# Patient Record
Sex: Male | Born: 1985 | Race: Black or African American | Hispanic: No | Marital: Married | Smoking: Never smoker
Health system: Southern US, Community
[De-identification: ages and names within clinical notes are randomized; demographics above are authoritative.]

## PROBLEM LIST (undated history)

## (undated) DIAGNOSIS — R42 Dizziness and giddiness: Secondary | ICD-10-CM

## (undated) HISTORY — DX: Dizziness and giddiness: R42

---

## 2017-09-15 ENCOUNTER — Encounter: Payer: Self-pay | Admitting: Neurology

## 2017-09-15 ENCOUNTER — Encounter (INDEPENDENT_AMBULATORY_CARE_PROVIDER_SITE_OTHER): Payer: Self-pay

## 2017-09-15 ENCOUNTER — Ambulatory Visit: Payer: BLUE CROSS/BLUE SHIELD | Admitting: Neurology

## 2017-09-15 VITALS — BP 137/87 | HR 61 | Ht 70.5 in | Wt 174.0 lb

## 2017-09-15 DIAGNOSIS — G119 Hereditary ataxia, unspecified: Secondary | ICD-10-CM | POA: Diagnosis not present

## 2017-09-15 DIAGNOSIS — R2689 Other abnormalities of gait and mobility: Secondary | ICD-10-CM

## 2017-09-15 DIAGNOSIS — R278 Other lack of coordination: Secondary | ICD-10-CM | POA: Diagnosis not present

## 2017-09-15 DIAGNOSIS — G319 Degenerative disease of nervous system, unspecified: Secondary | ICD-10-CM | POA: Diagnosis not present

## 2017-09-15 DIAGNOSIS — G3281 Cerebellar ataxia in diseases classified elsewhere: Secondary | ICD-10-CM

## 2017-09-15 NOTE — Progress Notes (Signed)
Subjective:    Patient ID: Roberto Randolph is a 32 y.o. male.  HPI     Roberto Foley, MD, PhD [facility] [address], [address] [address] [place], [place] [address]  Dear Roberto Randolph,   I saw your patient, Roberto Randolph, upon your kind request, in my neurologic clinic today for initial consultation of his abnormal brain MRI. The patient is unaccompanied today. As you know, Mr. Roberto Randolph is a 32 year old right-handed gentleman who presented to student health center at [facility] [employer] with right-sided leg pain. He has a history of vertigo for about 6-7 years. He had a brain MRI with and without contrast on 02/27/2017 and I reviewed the results: Diffuse cerebral atrophy including the vermis. Differential diagnosis would include hypothyroidism, EtOH, chronic delayed and use, paraneoplastic syndromes, congenital hereditary disorder such as pedal cerebellar ataxia, Friedreich's ataxia or mitochondrial disease. He had a lumbar spine MRI with and without contrast on 02/27/2017 and I reviewed the results: Chronic bilateral spondylolysis of L5 with minimal synovitis enhancement and degenerative facet L5-S1 without lateral recess stenosis. He reports issues with his balance and coordination.  He is married and lives with his wife, they have no children. He is a nonsmoker, drinks alcohol about 2-3 glasses per week. Has caffeine occasionally.  He reports that about 6 or 7 or even 8 years ago he started having issues with coordination and insecurity with complex tasks such as dancing, felt like he was misstepping when jogging, felt insecure with jumping. He has not noticed a progressive course but has probably plateaued at this time. He denies any family history of a similar issue, no evidence of hereditary ataxia, hereditary neurological issues. His family is originally from [place], he was born in [place], grew up some and [place] where his parents lived but he moved back to [place] to live with his aunt  when he was 44. He is currently studying to become an IT specialist. He denies any one-sided weakness or numbness or tingling, has had intermittent milder headaches. This was attributed to stress and lack of sleep. He was treated for anxiety as I understand some years ago in [place]. He has 4 siblings all of home live in [place]. He has had an intermittent head tremor and hand tremor, this gets worse when he is nervous. He tries to exercise and eat well and try to keep a schedule for his sleep. He denies any recent issues with fevers or chills or swelling or lymphadenopathy, no abdominal pain or shortness of breath. He has noticed no cognitive or memory issues, never had epilepsy or exposure to Dilantin. He had blood work in your office on 09/10/2017 with results pending. We will try to get those test results from your office.  His Past Medical History Is Significant For: Past Medical History:  Diagnosis Date  . Vertigo    His Past Surgical History Is Significant For:  His Family History Is Significant For: No family history on file.  His Social History Is Significant For: Social History   Socioeconomic History  . Marital status: Married    Spouse name: Not on file  . Number of children: Not on file  . Years of education: Not on file  . Highest education level: Not on file  Social Needs  . Financial resource strain: Not on file  . Food insecurity - worry: Not on file  . Food insecurity - inability: Not on file  . Transportation needs - medical: Not on file  . Transportation  needs - non-medical: Not on file  Occupational History  . Not on file  Tobacco Use  . Smoking status: Never Smoker  . Smokeless tobacco: Never Used  Substance and Sexual Activity  . Alcohol use: Yes    Alcohol/week: 1.2 - 1.8 oz    Types: 2 - 3 Glasses of wine per week  . Drug use: No  . Sexual activity: Not on file  Other Topics Concern  . Not on file  Social History Narrative  . Not on file    His  Allergies Are:  Allergies  Allergen Reactions  . Shrimp [Shellfish Allergy]   :   His Current Medications Are:  No outpatient encounter medications on file as of 09/15/2017.   No facility-administered encounter medications on file as of 09/15/2017.   : Review of Systems:  Out of a complete 14 point review of systems, all are reviewed and negative with the exception of these symptoms as listed below: Review of Systems  Neurological:       Pt presents today to discuss his gait unsteadiness. Pt says that he used to play sports and dance but he cannot do that now because he is so imbalanced.    Objective:  Neurological Exam  Physical Exam Physical Examination:   Vitals:   09/15/17 1429  BP: 137/87  Pulse: 61    General Examination: The patient is a very pleasant 32 y.o. male in no acute distress. He appears well-developed and well-nourished and well groomed. Is mildly anxious appearing.  HEENT: Normocephalic, atraumatic, pupils are equal, round and reactive to light and accommodation. Extraocular tracking is good without limitation to gaze excursion or nystagmus noted. Normal smooth pursuit is noted. Hearing is grossly intact. Face is symmetric with normal facial animation and normal facial sensation. Speech is clear with no dysarthria noted. There is no hypophonia. He has no voice tremor but he does have a subtle intermittent head tremor which is a side to side fairly fast frequency tremor. Tongue protrudes centrally and palate elevates symmetrically.    Chest: Clear to auscultation without wheezing, rhonchi or crackles noted.  Heart: S1+S2+0, regular and normal without murmurs, rubs or gallops noted.   Abdomen: Soft, non-tender and non-distended with normal bowel sounds appreciated on auscultation.  Extremities: There is no pitting edema in the distal lower extremities bilaterally. Pedal pulses are intact.  Skin: Warm and dry without trophic changes noted.  Musculoskeletal:  exam reveals no obvious joint deformities, tenderness or joint swelling or erythema.   Neurologically:  Mental status: The patient is awake, alert and oriented in all 4 spheres. His immediate and remote memory, attention, language skills and fund of knowledge are appropriate. There is no evidence of aphasia, agnosia, apraxia or anomia. Speech is clear with normal prosody and enunciation. Thought process is linear. Mood is normal and affect is normal.  Cranial nerves II - XII are as described above under HEENT exam. In addition: shoulder shrug is normal with equal shoulder height noted. Motor exam: Normal bulk, strength and tone is noted. There is no drift, or rebound. Romberg is negative, with the exception of mild swaying. Reflexes are 1+ throughout but possibly absent in both ankles, toes are equivocal or downgoing bilaterally. Fine motor skills and coordination: He has no significant findings motor dyscontrol in the upper and lower extremities. Heel-to-shin shows no obvious dysmetria. On finger to nose testing, he has no obvious dysmetria or intention tremor with the exception of minimal trembling or endpoint intention tremor noted.  Sensory exam: intact to light touch, pinprick, vibration, temperature sense in the upper and lower extremities.  Gait, station and balance: He stands easily, but stands naturally slightly wide-based. He walks with slight insecurity noted. Tandem walk is challenging for him.                Assessment and Plan:   In summary, Kamal Jurgens is a very pleasant 32 y.o.-year old male with a  benign medical history who presents for neurologic consultation for his several year history of incoordination and balance problems, recent finding of significance cerebellar and vermis atrophy on a brain MRI. The differential diagnosis does include in his case congenital cerebellar atrophy, paraneoplastic cerebellar degeneration, and some form of hereditary spinocerebellar atrophy. He has  a mildly abnormal exam, no telltale family history of a similar issue but some of his family history is not known in detail. I would like to proceed with a cervical spine MRI and CT chest, abdomen and pelvis to screen for an underlying cancer. He does not endorse much in the way of progression or systemic issues or recurrent neurological symptoms otherwise. His exam is fairly benign otherwise. Ultimately, he may benefit from evaluation and further treatment at a larger movement disorders center such as [facility], he would prefer [facility] as he lives in [place]. He may benefit from additional blood work including paraneoplastic profile or cerebellar ataxia panel.we will keep them posted as to his test results for now and proceed from there. I answered all his questions today and he was in agreement.  Thank you very much for allowing me to participate in the care of this nice patient. If I can be of any further assistance to you please do not hesitate to call me at [phone].  Sincerely,   Roberto Foley, MD, PhD

## 2017-09-15 NOTE — Patient Instructions (Addendum)
You have a mildly abnormal exam with regards to your coordination and walking.  You have evidence of shrinkage/atrophy of your cerebellum.  There are many reasons for cerebellar atrophy including hereditary/genetic causes, being born with a smaller cerebellum and some serious causes, like an underlying cancer.  As discussed we will proceed with a CT chest, abdomen and pelvis to screen for any underlying lesion. In addition, I would like to do an MRI of your neck to look at the size and structure of the cervical spine  and particularly spinal cord.  Based on these initial test results, I would like to suggest a referral to a more comprehensive movement disorder center such as at [facility]. You may benefit from additional specialized blood tests.  For now, we will keep you posted as to your test results.

## 2017-09-17 ENCOUNTER — Telehealth: Payer: Self-pay | Admitting: Neurology

## 2017-09-17 DIAGNOSIS — G119 Hereditary ataxia, unspecified: Secondary | ICD-10-CM

## 2017-09-17 DIAGNOSIS — G3281 Cerebellar ataxia in diseases classified elsewhere: Secondary | ICD-10-CM

## 2017-09-17 NOTE — Telephone Encounter (Signed)
Drenda FreezeFran with [facility] informed me the CT orders need to be switch to a CT with contrast. Not with/without contrast. When you get a chance can you please switch the orders.

## 2017-09-17 NOTE — Telephone Encounter (Signed)
Noted, thank you

## 2017-09-17 NOTE — Telephone Encounter (Signed)
Received VO from Dr. Frances FurbishAthar for CT w contrast for chest, abdomen, and pelvis. Orders changed.

## 2017-09-26 ENCOUNTER — Ambulatory Visit
Admission: RE | Admit: 2017-09-26 | Discharge: 2017-09-26 | Disposition: A | Discharge status: Home / Self Care | Payer: BLUE CROSS/BLUE SHIELD | Source: Ambulatory Visit | Attending: Neurology | Admitting: Neurology

## 2017-09-26 DIAGNOSIS — G3281 Cerebellar ataxia in diseases classified elsewhere: Secondary | ICD-10-CM

## 2017-09-26 DIAGNOSIS — G319 Degenerative disease of nervous system, unspecified: Secondary | ICD-10-CM

## 2017-09-26 DIAGNOSIS — G119 Hereditary ataxia, unspecified: Secondary | ICD-10-CM

## 2017-09-26 MED ORDER — GADOBENATE DIMEGLUMINE 529 MG/ML IV SOLN
15.0000 mL | Freq: Once | INTRAVENOUS | Status: AC | PRN
  Administered 2017-09-26: 15 mL via INTRAVENOUS

## 2017-09-26 MED ORDER — IOPAMIDOL (ISOVUE-300) INJECTION 61%
100.0000 mL | Freq: Once | INTRAVENOUS | Status: AC | PRN
  Administered 2017-09-26: 100 mL via INTRAVENOUS

## 2017-09-29 NOTE — Progress Notes (Signed)
Please advise patient that his cervical spine MRI with and without contrast showed benign findings, his CT abdomen, chest and pelvis showed benign findings, no evidence of malignancy thankfully. If he still wishes, I would like to refer him to [facility] for further evaluation of his cerebellar atrophy. We discussed this at his visit with me. Please let me know and I'll place a referral. I will see him back as needed at this point.

## 2017-09-30 ENCOUNTER — Telehealth: Payer: Self-pay

## 2017-09-30 DIAGNOSIS — G319 Degenerative disease of nervous system, unspecified: Secondary | ICD-10-CM

## 2017-09-30 NOTE — Telephone Encounter (Signed)
-----   Message from Huston FoleySaima Athar, MD sent at 09/29/2017  4:36 PM EST ----- Please advise patient that his cervical spine MRI with and without contrast showed benign findings, his CT abdomen, chest and pelvis showed benign findings, no evidence of malignancy thankfully. If he still wishes, I would like to refer him to [facility] for further evaluation of his cerebellar atrophy. We discussed this at his visit with me. Please let me know and I'll place a referral. I will see him back as needed at this point.

## 2017-09-30 NOTE — Telephone Encounter (Signed)
I called pt. I advised him that his cervical spine MRI, CT abdomen, pelvis, and chest all showed benign findings, no evidence of malignancy. Dr. Frances FurbishAthar will place a referral for [facility] if [facility] wishes. Pt does want this referral to [facility]. Pt verbalized understanding of results. Pt had no questions at this time but was encouraged to call back if questions arise.

## 2017-10-01 NOTE — Telephone Encounter (Signed)
Referral has been been sent to [facility]  Telephone [phone] - fax - [phone]. Called Patient   And left detailed message relaying he had to go to [facility] to pick up his MRI and CT's to take with him to [facility] [phone].

## 2017-10-02 ENCOUNTER — Ambulatory Visit
Admission: RE | Admit: 2017-10-02 | Discharge: 2017-10-02 | Disposition: A | Discharge status: Home / Self Care | Payer: BLUE CROSS/BLUE SHIELD | Source: Ambulatory Visit | Attending: Neurology | Admitting: Neurology

## 2017-10-02 ENCOUNTER — Telehealth: Payer: Self-pay

## 2017-10-02 DIAGNOSIS — M25521 Pain in right elbow: Secondary | ICD-10-CM

## 2017-10-02 NOTE — Telephone Encounter (Signed)
Roberto JoinerRebecca called back, was informed of the order.

## 2017-10-02 NOTE — Telephone Encounter (Signed)
Received a call from [facility], Lurena JoinerRebecca, pt presented to their office with pain in his R elbow from where the IV was for his CT scans. The radiologist looked out it, found the area to be hard, recommends that we perform an xray to make sure the IV catheter is not still in the vein. The are asking for an order for a 2 view Xray r elbow.  I spoke with Dr. Frances FurbishAthar and she is agreeable to this. Order placed.  I called Lurena JoinerRebecca back, no answer, left a message asking her to call us back.

## 2017-10-06 ENCOUNTER — Telehealth: Payer: Self-pay | Admitting: Neurology

## 2017-10-06 NOTE — Telephone Encounter (Signed)
I spoke with Dr. Frances FurbishAthar. Pt's right elbow x ray is normal.  I have already discussed with pt his CT and MRI results.  I called pt. No answer, left a message asking him to call me back.

## 2017-10-06 NOTE — Telephone Encounter (Signed)
Patient requesting x-ray results of elbow,MRI and CT results.

## 2017-10-07 NOTE — Telephone Encounter (Signed)
Pt called back, I advised him that his xray of his elbow was normal, no catheter retained. Pt says that his IV site on right elbow is swollen, red, and hard. I encouraged him to see PCP to discuss. He says that he has already seen his PCP and they are unsure of what to do about it. I encouraged perhaps an urgent care appointment for it to be examined too, and if it is quite painful, he should consider going to ER. Pt said that it is not painful. Pt said he will consider UC.

## 2018-10-06 IMAGING — CT CT ABD-PELV W/ CM
2 of 4 series · 15 of 46 positions shown, 17 images · IV contrast (APPLIED)
Comparison: None.

CLINICAL DATA: Cerebellar ataxia and abnormal brain MRI. Evaluate
for primary malignancy.

EXAM:
CT CHEST, ABDOMEN, AND PELVIS WITH CONTRAST
TECHNIQUE: Multidetector CT imaging of the chest, abdomen and pelvis was
performed following the standard protocol during bolus
administration of intravenous contrast.
CONTRAST:  100mL [id number] IOPAMIDOL ([id number]) INJECTION 61%

[Series 2: chest/abd/pelvis w/cm · axial · 0.78mm/px · z∈[+609,+1206]mm · 12 of 223 slices shown, 14 images]
[im 12/223  soft-tissue]
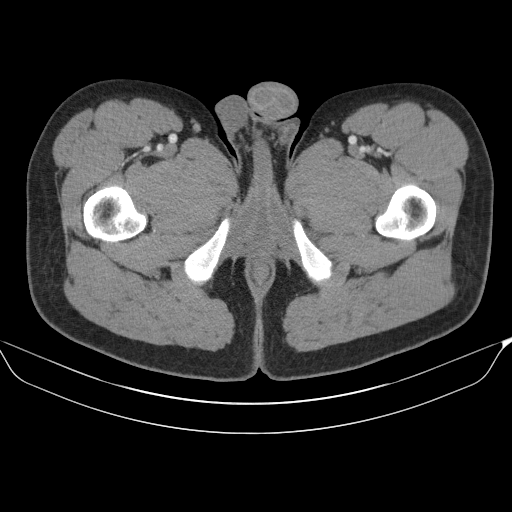
[im 12/223  bone]
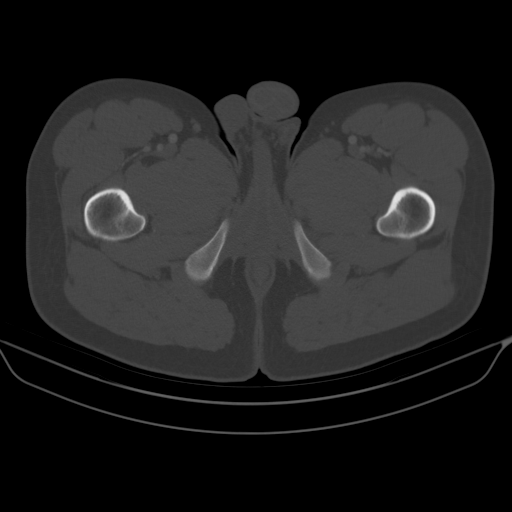
[im 34/223  soft-tissue]
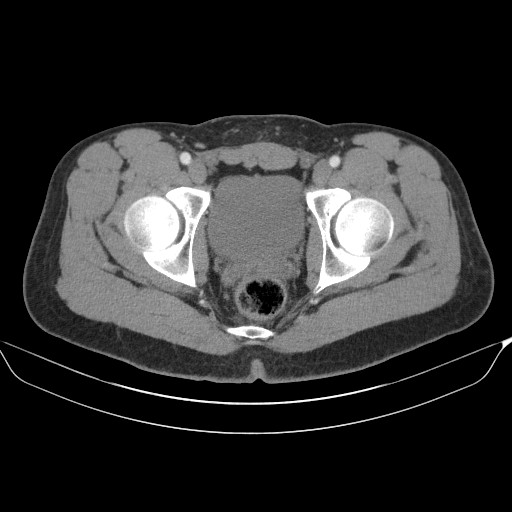
[im 45/223  soft-tissue]
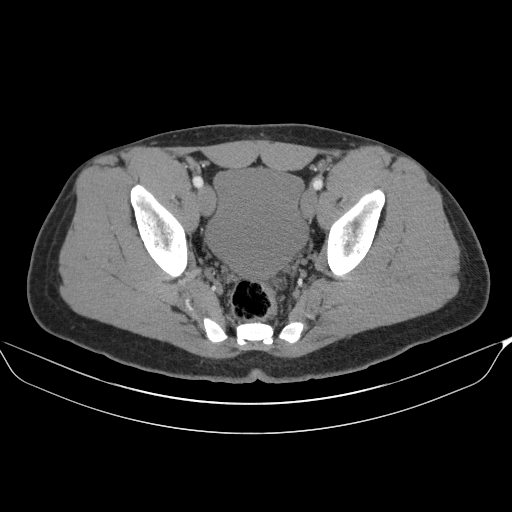
[im 67/223  soft-tissue]
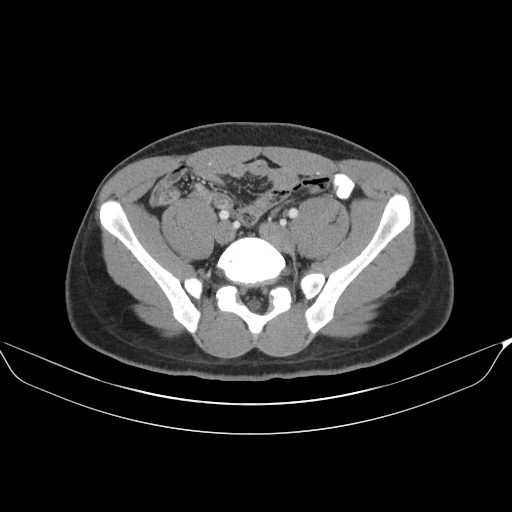
[im 89/223  soft-tissue]
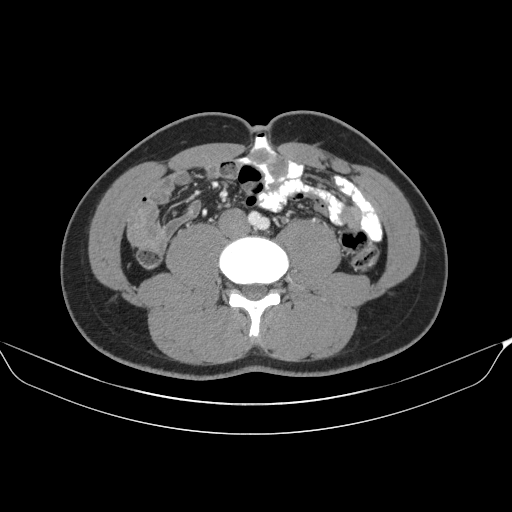
[im 100/223  soft-tissue]
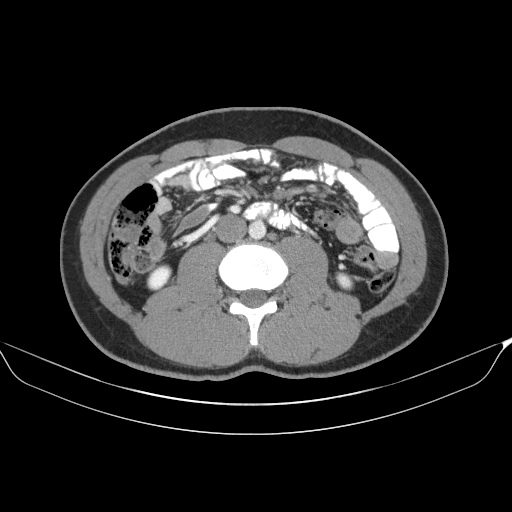
[im 123/223  soft-tissue]
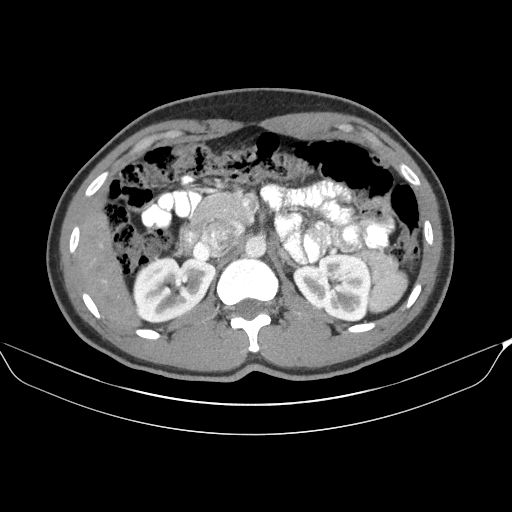
[im 134/223  soft-tissue]
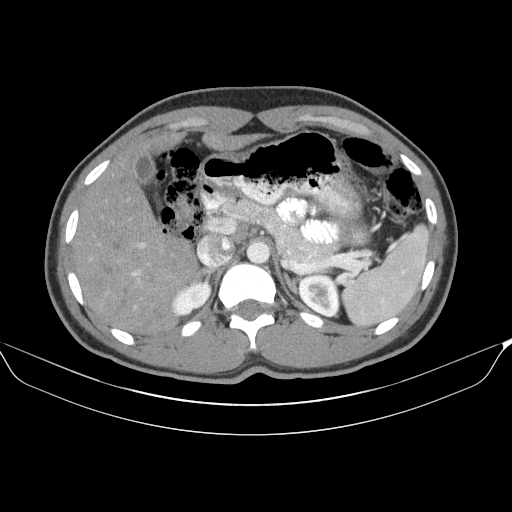
[im 156/223  soft-tissue]
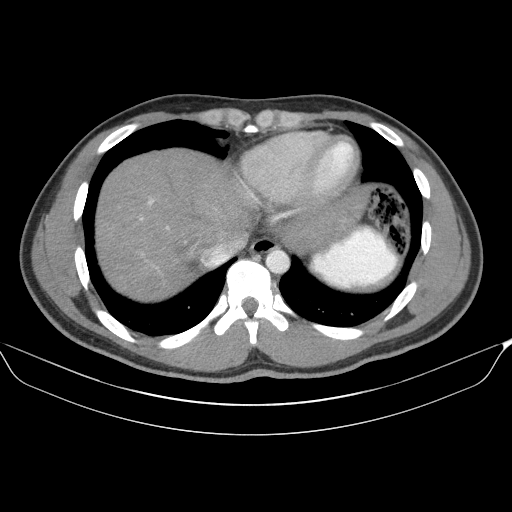
[im 156/223  bone]
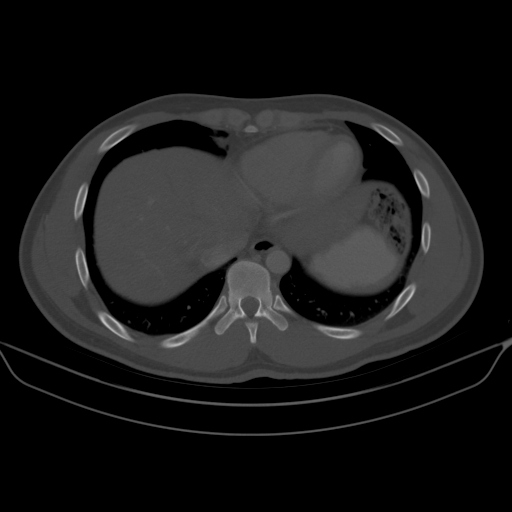
[im 178/223  soft-tissue]
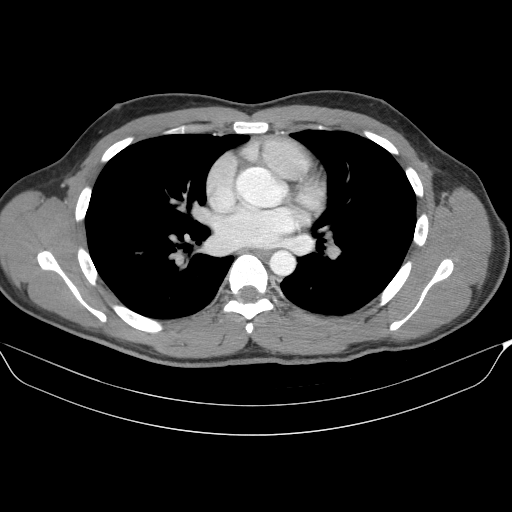
[im 189/223  soft-tissue]
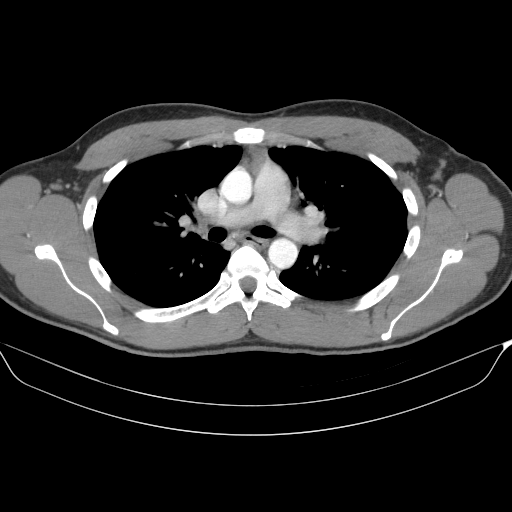
[im 211/223  soft-tissue]
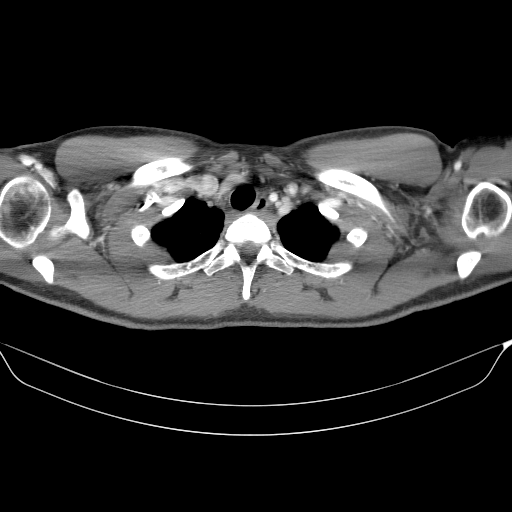

[Series 3: cor · coronal · 0.80mm/px · 3 of 80 slices shown]
[im 27/80  soft-tissue]
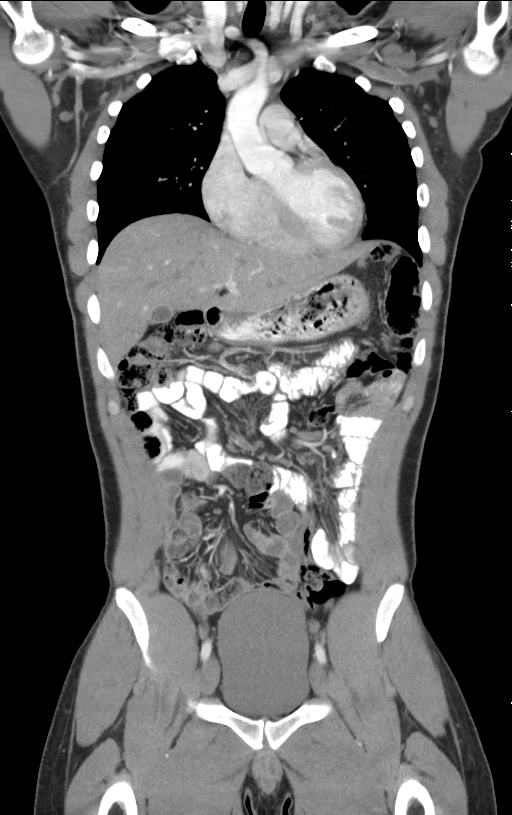
[im 36/80  soft-tissue]
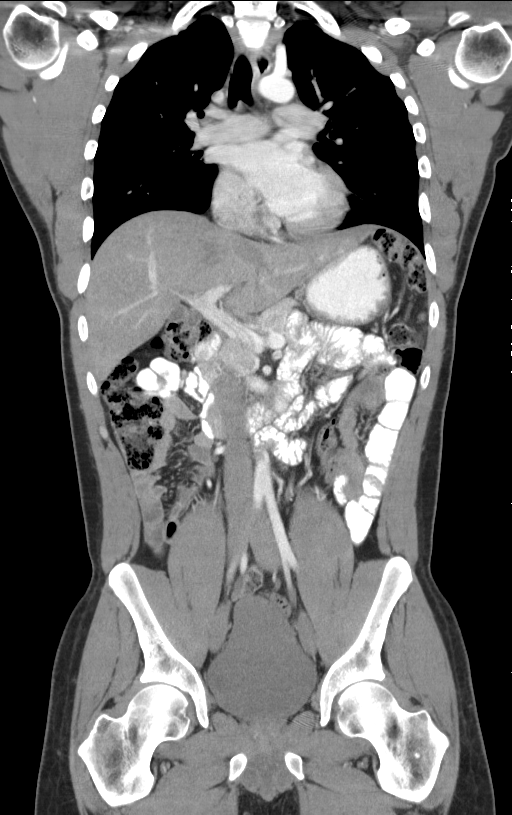
[im 44/80  soft-tissue]
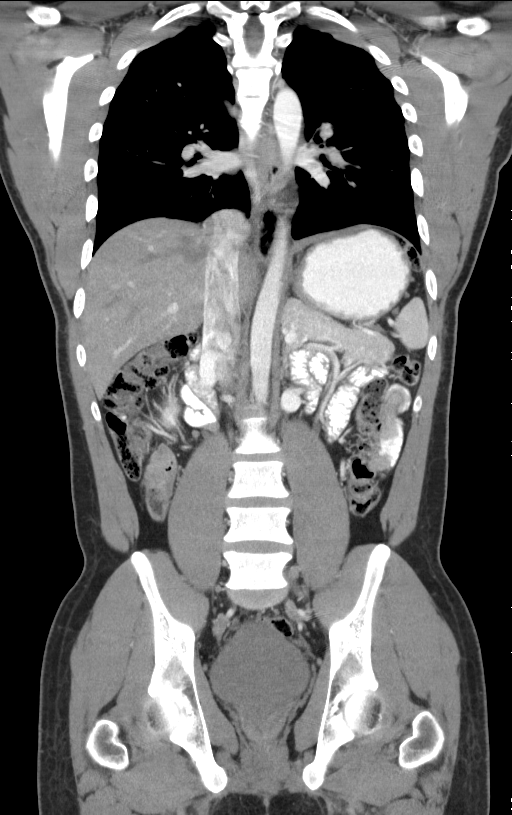

[15 of 46 positions shown; findings below may reference images not displayed]

FINDINGS: CT CHEST FINDINGS

Cardiovascular: No acute findings.

Mediastinum/Lymph Nodes: No masses or pathologically enlarged lymph
nodes identified.

Lungs/Pleura: No pulmonary infiltrate or mass identified. No
effusion present.

Musculoskeletal:  No suspicious bone lesions identified.

CT ABDOMEN AND PELVIS FINDINGS

Hepatobiliary: No masses identified. Gallbladder is unremarkable.

Pancreas:  No mass or inflammatory changes.

Spleen:  Within normal limits in size and appearance.

Adrenals/Urinary tract:  No masses or hydronephrosis.

Stomach/Bowel: No evidence of obstruction, inflammatory process, or
abnormal fluid collections. Normal appendix visualized.

Vascular/Lymphatic: No pathologically enlarged lymph nodes
identified. No abdominal aortic aneurysm.

Reproductive:  No mass or other significant abnormality identified.

Other:  None.

Musculoskeletal: No suspicious bone lesions identified. Bilateral L5
pars defects are noted, without associated spondylolisthesis.
IMPRESSION: No evidence of malignancy or other acute findings.

Bilateral L5 pars defects, without associated spondylolisthesis.

## 2018-10-11 IMAGING — CR DG ELBOW 2V*R*
2 series · 2 of 2 positions shown · non-contrast
Comparison: None.

CLINICAL DATA: Anterior right elbow pain in the antecubital fossa.
IV catheter placed in this location 6 days ago for contrast
injection for MRI and CT. Evaluate for retained foreign body.

EXAM:
RIGHT ELBOW - 2 VIEW

[x elbow ap right]
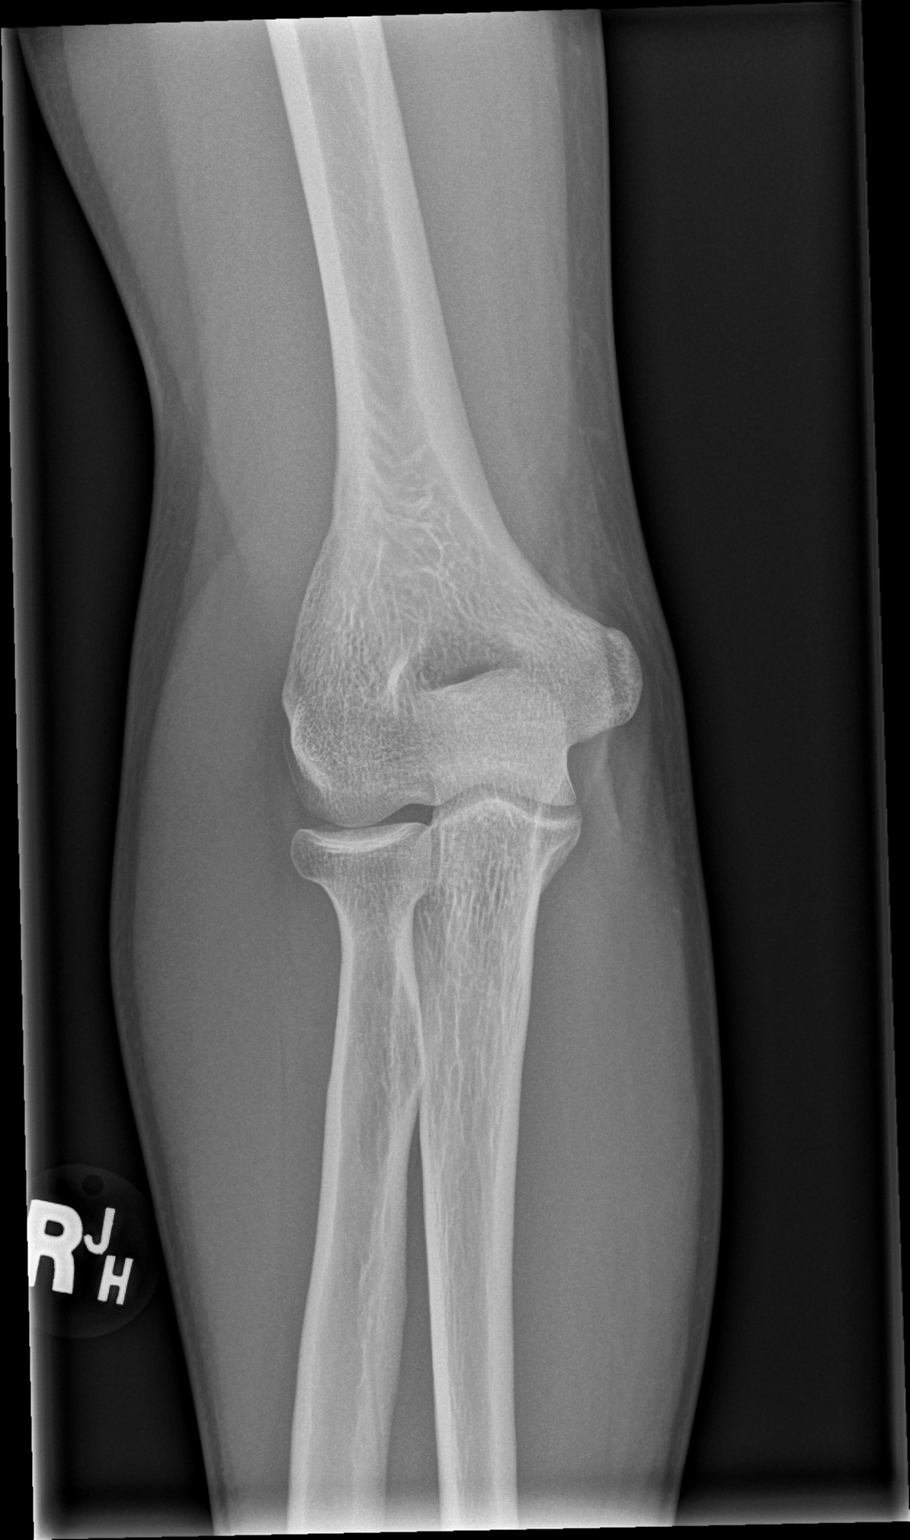

[x elbow lat right]
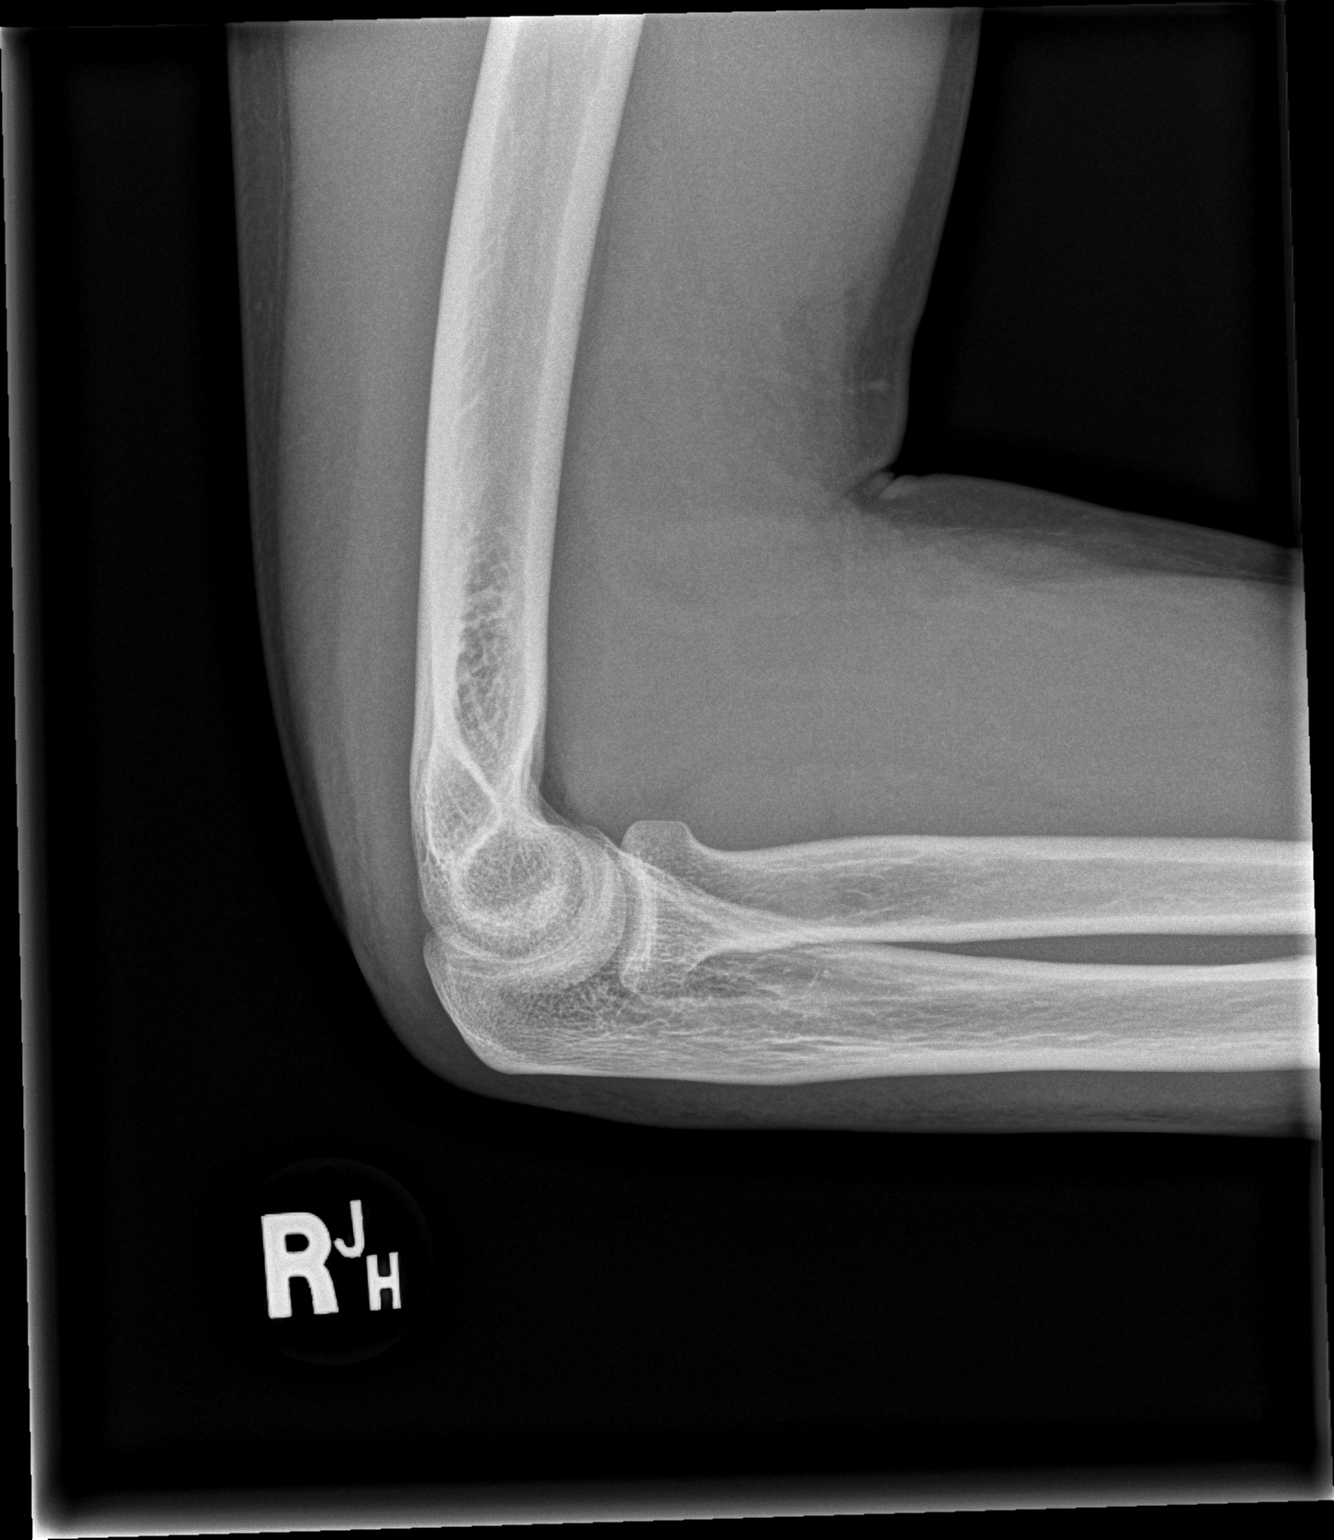

[2 of 2 positions shown; findings below may reference images not displayed]

FINDINGS: No radiopaque foreign body is seen, with special attention to the
antecubital fossa. There is no fracture or dislocation. Bone
mineralization appears normal.
IMPRESSION: Negative.  No retained catheter fragment identified.
